# Patient Record
Sex: Male | Born: 1985 | Race: Black or African American | Hispanic: No | Marital: Single
Health system: Midwestern US, Community
[De-identification: ages and names within clinical notes are randomized; demographics above are authoritative.]

---

## 2015-12-24 ENCOUNTER — Inpatient Hospital Stay
Admit: 2015-12-24 | Discharge: 2015-12-24 | Disposition: A | Discharge status: Home / Self Care | Payer: Self-pay | Attending: Emergency Medicine

## 2015-12-24 DIAGNOSIS — K029 Dental caries, unspecified: Secondary | ICD-10-CM

## 2015-12-24 MED ORDER — HYDROCODONE-ACETAMINOPHEN 5 MG-325 MG TAB
5-325 mg | ORAL_TABLET | ORAL | 0 refills | Status: AC | PRN
Start: 2015-12-24 — End: ?

## 2015-12-24 MED ORDER — PENICILLIN V-K 500 MG TAB
500 mg | ORAL_TABLET | Freq: Four times a day (QID) | ORAL | 0 refills | Status: AC
Start: 2015-12-24 — End: 2015-12-31

## 2015-12-24 NOTE — ED Provider Notes (Signed)
HPI Comments: 6:08 PM Roberto Harper is a 30 y.o. male presents to the ED with c/o left upper dental pain onset several days ago. Pt denied n/v/d, CP, SOB, or cough. All other sx denied. No other complaints at this time.         PCP-No primary care provider on file.      Patient is a 30 y.o. male presenting with dental problem. The history is provided by the patient.   Dental Pain             History reviewed. No pertinent past medical history.    History reviewed. No pertinent surgical history.      History reviewed. No pertinent family history.    Social History     Social History   ??? Marital status: N/A     Spouse name: N/A   ??? Number of children: N/A   ??? Years of education: N/A     Occupational History   ??? Not on file.     Social History Main Topics   ??? Smoking status: Not on file   ??? Smokeless tobacco: Not on file   ??? Alcohol use Not on file   ??? Drug use: Not on file   ??? Sexual activity: Not on file     Other Topics Concern   ??? Not on file     Social History Narrative   ??? No narrative on file         ALLERGIES: Review of patient's allergies indicates no known allergies.    Review of Systems   Constitutional: Negative for fever.   HENT: Positive for dental problem (left). Negative for sore throat.    Eyes: Negative for redness and visual disturbance.   Respiratory: Negative for shortness of breath and wheezing.    Cardiovascular: Negative for chest pain.   Gastrointestinal: Negative for abdominal pain, diarrhea, nausea and vomiting.   Endocrine: Negative for polyuria.   Genitourinary: Negative for dysuria.   Musculoskeletal: Negative for arthralgias and neck stiffness.   Skin: Negative for rash.   Neurological: Negative for headaches.   All other systems reviewed and are negative.      There were no vitals filed for this visit.         Physical Exam   Constitutional: He appears well-developed and well-nourished. No distress.   HENT:   Head: Normocephalic.    Tooth #19 - rotten to the gumline - no erythema or exudate   Pulmonary/Chest: Effort normal. No stridor. No respiratory distress.   Neurological: He is alert.   Skin: He is not diaphoretic.   Nursing note and vitals reviewed.       MDM  Number of Diagnoses or Management Options  Dental caries:   Dentalgia:   Diagnosis management comments: Needs to see a dentist, ABX, [facility], [facility], work note    ED Course       Procedures      Vitals:  No data found.        Medications ordered:   Medications - No data to display      Lab findings:  No results found for this or any previous visit (from the past 12 hour(s)).    EKG interpretation by ED Physician:      X-Ray, CT or other radiology findings or impressions:  No orders to display       Progress notes, Consult notes or additional Procedure notes:       Reevaluation of patient:  Disposition:  Diagnosis:   1. Dentalgia    2. Dental caries        Disposition: dishcarge    Follow-up Information     Follow up With Details Comments Contact Info    [facility] Go in 1 day For follow up [address]  [place] [id number]  [phone]    [facility] EMERGENCY DEPT Go to As needed [address]  [place] [id number]  [phone]            Patient's Medications   Start Taking    HYDROCODONE-ACETAMINOPHEN (NORCO) 5-325 MG PER TABLET    Take 1 Tab by mouth every four (4) hours as needed for Pain. Max Daily Amount: 6 Tabs.    PENICILLIN V POTASSIUM (VEETID) 500 MG TABLET    Take 1 Tab by mouth four (4) times daily for 7 days.   Continue Taking    No medications on file   These Medications have changed    No medications on file   Stop Taking    No medications on file     Scribe Attestation  David Swazil[place]Jordan scribing for and in the presence of Kirke ShaggyMichael L Ani Deoliveira, MD 6:09 PM, 12/24/15.    Physician Attestation  I personally performed the services described in the documentation, reviewed the documentation, as recorded by the scribe in my presence, and  it accurately and completely records my words and actions.    Kirke ShaggyMichael L Karessa Onorato, MD 6:09 PM 12/24/15        Signed by : David SwazilandJordan, Scribe, 12/24/15 at 6:09 PM

## 2015-12-24 NOTE — ED Notes (Signed)
Tooth #19

## 2015-12-24 NOTE — ED Triage Notes (Signed)
Patient reports tooth ache for an hour.

## 2015-12-24 NOTE — ED Notes (Signed)
I have reviewed discharge instructions and medications with the patient. The patient verbalized understanding.Patient is stable and in good condition with normal vital signs. Patient chose to discharge with their wristband; privacy risks were discussed. Patient escorted to lobby.

## 2021-01-21 DIAGNOSIS — R519 Headache, unspecified: Secondary | ICD-10-CM | POA: Insufficient documentation

## 2021-01-21 DIAGNOSIS — H5789 Other specified disorders of eye and adnexa: Secondary | ICD-10-CM | POA: Insufficient documentation

## 2021-01-22 ENCOUNTER — Emergency Department (HOSPITAL_COMMUNITY): Payer: Medicaid Other

## 2021-01-22 ENCOUNTER — Emergency Department (HOSPITAL_COMMUNITY)
Admission: EM | Admit: 2021-01-22 | Discharge: 2021-01-22 | Disposition: A | Discharge status: Home / Self Care | Payer: Medicaid Other | Attending: Emergency Medicine | Admitting: Emergency Medicine

## 2021-01-22 ENCOUNTER — Other Ambulatory Visit: Payer: Self-pay

## 2021-01-22 ENCOUNTER — Encounter (HOSPITAL_COMMUNITY): Payer: Self-pay

## 2021-01-22 DIAGNOSIS — R519 Headache, unspecified: Secondary | ICD-10-CM

## 2021-01-22 LAB — CBC WITH DIFFERENTIAL/PLATELET
Abs Immature Granulocytes: 0.01 10*3/uL (ref 0.00–0.07)
Basophils Absolute: 0 10*3/uL (ref 0.0–0.1)
Basophils Relative: 1 %
Eosinophils Absolute: 0 10*3/uL (ref 0.0–0.5)
Eosinophils Relative: 0 %
HCT: 45.7 % (ref 39.0–52.0)
Hemoglobin: 14.8 g/dL (ref 13.0–17.0)
Immature Granulocytes: 0 %
Lymphocytes Relative: 38 %
Lymphs Abs: 2.4 10*3/uL (ref 0.7–4.0)
MCH: 27.2 pg (ref 26.0–34.0)
MCHC: 32.4 g/dL (ref 30.0–36.0)
MCV: 84 fL (ref 80.0–100.0)
Monocytes Absolute: 0.7 10*3/uL (ref 0.1–1.0)
Monocytes Relative: 11 %
Neutro Abs: 3.2 10*3/uL (ref 1.7–7.7)
Neutrophils Relative %: 50 %
Platelets: 278 10*3/uL (ref 150–400)
RBC: 5.44 MIL/uL (ref 4.22–5.81)
RDW: 13.4 % (ref 11.5–15.5)
WBC: 6.3 10*3/uL (ref 4.0–10.5)
nRBC: 0 % (ref 0.0–0.2)

## 2021-01-22 LAB — COMPREHENSIVE METABOLIC PANEL
ALT: 14 U/L (ref 0–44)
AST: 18 U/L (ref 15–41)
Albumin: 5.2 g/dL — ABNORMAL HIGH (ref 3.5–5.0)
Alkaline Phosphatase: 71 U/L (ref 38–126)
Anion gap: 11 (ref 5–15)
BUN: 15 mg/dL (ref 6–20)
CO2: 27 mmol/L (ref 22–32)
Calcium: 10.3 mg/dL (ref 8.9–10.3)
Chloride: 103 mmol/L (ref 98–111)
Creatinine, Ser: 1.14 mg/dL (ref 0.61–1.24)
GFR, Estimated: >60 mL/min (ref >60)
Glucose, Bld: 124 mg/dL — ABNORMAL HIGH (ref 70–99)
Potassium: 4.1 mmol/L (ref 3.5–5.1)
Sodium: 141 mmol/L (ref 135–145)
Total Bilirubin: 0.6 mg/dL (ref 0.3–1.2)
Total Protein: 9.4 g/dL — ABNORMAL HIGH (ref 6.5–8.1)

## 2021-01-22 LAB — CK: Total CK: 238 U/L (ref 49–397)

## 2021-01-22 MED ORDER — DIPHENHYDRAMINE HCL 50 MG/ML IJ SOLN
25.0000 mg | Freq: Once | INTRAMUSCULAR | Status: DC
  Filled 2021-01-22: qty 1

## 2021-01-22 MED ORDER — KETOROLAC TROMETHAMINE 30 MG/ML IJ SOLN
30.0000 mg | Freq: Once | INTRAMUSCULAR | Status: DC
  Filled 2021-01-22: qty 1

## 2021-01-22 MED ORDER — SODIUM CHLORIDE 0.9 % IV BOLUS
1000.0000 mL | Freq: Once | INTRAVENOUS | Status: AC
  Administered 2021-01-22: 1000 mL via INTRAVENOUS

## 2021-01-22 MED ORDER — METOCLOPRAMIDE HCL 5 MG/ML IJ SOLN
10.0000 mg | Freq: Once | INTRAMUSCULAR | Status: DC
  Filled 2021-01-22: qty 2

## 2021-01-22 NOTE — Progress Notes (Signed)
..  Transition of Care [facility]) - Emergency Department Mini Assessment   Patient Details  Name: Micheal Harrell MRN: [id number] Date of Birth: 16-Nov-1985  Transition of Care [facility]) CM/SW Contact:    Larrie Kass, LCSW Phone Number: 01/22/2021, 8:47 AM   Clinical Narrative:  CSW spoke with pt to verify address. Pt reproted his address is [address] [place] ,[place]. Pt reported not having anyone to pick him up and requested transportation. CSW to contact safe transport.   Valentina Shaggy.Jadae Steinke, MSW, LCSWA [facility]  Transitions of Care Clinical Social Worker I Direct Dial: [phone]  Fax: [phone] [email or web address]    ED Mini Assessment:    Barriers to Discharge: No Barriers Identified        Interventions which prevented an admission or readmission: Transportation Screening    Patient Contact and Communications        ,                 Admission diagnosis:  Flu Symptoms There are no problems to display for this patient.  PCP:  Pcp, No Pharmacy:  No Pharmacies Listed

## 2021-01-22 NOTE — ED Triage Notes (Signed)
Pt reports headache all day today.

## 2021-01-22 NOTE — ED Provider Notes (Signed)
[facility]-EMERGENCY DEPT Provider Note   CSN: [id number] Arrival date & time: 01/21/21  2325     History Chief Complaint  Patient presents with   Headache    Micheal Harrell is a 35 y.o. male.  Patient is a poor historian.  Unclear if he has an underlying psychiatric illness.  Patient apparently called the police because he was having a headache after walking around outside today. Denies any fall or trauma.  Headache is gradual in onset.  States there is no associated nausea, vomiting, photophobia or phonophobia.  No fever.  No focal weakness, numbness or tingling.  No chest pain or shortness of breath.  Not taking medication for headache.  Denies thunderclap onset of his headache.  Denies any drug or alcohol use.  His eyes are very injected but he denies any visual complaints. States he does not have any thoughts of wanting to hurt himself or anyone else.  Chart review shows that patient has multiple visits for schizophrenia and paranoia within the [facility] system.   The history is provided by the patient and the police. The history is limited by the condition of the patient.  Headache Associated symptoms: no abdominal pain, no congestion, no cough, no dizziness, no fever, no myalgias, no nausea, no numbness, no vomiting and no weakness       History reviewed. No pertinent past medical history.  There are no problems to display for this patient.   History reviewed. No pertinent surgical history.     No family history on file.  Social History   Tobacco Use   Smoking status: Never   Smokeless tobacco: Never    Home Medications Prior to Admission medications   Not on File    Allergies    Patient has no known allergies.  Review of Systems   Review of Systems  Constitutional:  Negative for activity change, appetite change and fever.  HENT:  Negative for congestion.   Eyes:  Positive for redness. Negative for visual disturbance.  Respiratory:   Negative for cough, chest tightness and shortness of breath.   Cardiovascular:  Negative for chest pain.  Gastrointestinal:  Negative for abdominal pain, nausea and vomiting.  Genitourinary:  Negative for dysuria and hematuria.  Musculoskeletal:  Negative for arthralgias and myalgias.  Skin:  Negative for rash.  Neurological:  Positive for headaches. Negative for dizziness, weakness, light-headedness and numbness.   all other systems are negative except as noted in the HPI and PMH.   Physical Exam Updated Vital Signs BP 134/86 (BP Location: Left Arm)   Pulse 99   Temp 98.2 F (36.8 C)   Resp 18   SpO2 100%   Physical Exam Vitals and nursing note reviewed.  Constitutional:      General: He is not in acute distress.    Appearance: He is well-developed.     Comments: Flat affect  HENT:     Head: Normocephalic and atraumatic.     Mouth/Throat:     Pharynx: No oropharyngeal exudate.  Eyes:     Conjunctiva/sclera: Conjunctivae normal.     Pupils: Pupils are equal, round, and reactive to light.     Comments: Conjunctival injection bilaterally.  Neck:     Comments: No meningismus. Cardiovascular:     Rate and Rhythm: Normal rate and regular rhythm.     Heart sounds: Normal heart sounds. No murmur heard. Pulmonary:     Effort: Pulmonary effort is normal. No respiratory distress.  Breath sounds: Normal breath sounds.  Abdominal:     Palpations: Abdomen is soft.     Tenderness: There is no abdominal tenderness. There is no guarding or rebound.  Musculoskeletal:        General: No tenderness. Normal range of motion.     Cervical back: Normal range of motion and neck supple.  Skin:    General: Skin is warm.  Neurological:     Mental Status: He is alert and oriented to person, place, and time.     Cranial Nerves: No cranial nerve deficit.     Motor: No abnormal muscle tone.     Coordination: Coordination normal.     Comments: CN 2-12 intact, no ataxia on finger to nose, no  nystagmus, 5/5 strength throughout, no pronator drift, Romberg negative, normal gait.   Psychiatric:        Behavior: Behavior normal.    ED Results / Procedures / Treatments   Labs (all labs ordered are listed, but only abnormal results are displayed) Labs Reviewed  COMPREHENSIVE METABOLIC PANEL - Abnormal; Notable for the following components:      Result Value   Glucose, Bld 124 (*)    Total Protein 9.4 (*)    Albumin 5.2 (*)    All other components within normal limits  CBC WITH DIFFERENTIAL/PLATELET  CK    EKG None  Radiology CT Head Wo Contrast  Result Date: 01/22/2021 CLINICAL DATA:  Head trauma with headache EXAM: CT HEAD WITHOUT CONTRAST TECHNIQUE: Contiguous axial images were obtained from the base of the skull through the vertex without intravenous contrast. COMPARISON:  None. FINDINGS: Brain: No evidence of acute infarction, hemorrhage, hydrocephalus, extra-axial collection or mass lesion/mass effect. Vascular: No hyperdense vessel or unexpected calcification. Skull: Normal. Negative for fracture or focal lesion. Sinuses/Orbits: No acute finding. IMPRESSION: Negative head CT. Electronically Signed   By: Marnee Spring M.D.   On: 01/22/2021 04:37    Procedures Procedures   Medications Ordered in ED Medications  sodium chloride 0.9 % bolus 1,000 mL (has no administration in time range)  ketorolac (TORADOL) 30 MG/ML injection 30 mg (has no administration in time range)  metoCLOPramide (REGLAN) injection 10 mg (has no administration in time range)  diphenhydrAMINE (BENADRYL) injection 25 mg (has no administration in time range)    ED Course  I have reviewed the triage vital signs and the nursing notes.  Pertinent labs & imaging results that were available during my care of the patient were reviewed by me and considered in my medical decision making (see chart for details).    MDM Rules/Calculators/A&P                         Right-sided headache gradual in  onset.  Nonfocal neurological exam.  Denies thunderclap onset.  Low suspicion for subarachnoid hemorrhage, meningitis, temporal arteritis  Patient hydrated.  He actually refused medication stating his headache was better.  Labs are reassuring with normal electrolytes and normal CK.  CT head is negative.  Patient appears stable for discharge.  He is tolerating p.o. and ambulatory.  Denies suicidal or homicidal thoughts. He is visiting from [place] and not certain how he got here or where he is intending to go.  Case manager will be consulted for assistance. Final Clinical Impression(s) / ED Diagnoses Final diagnoses:  Bad headache    Rx / DC Orders ED Discharge Orders     None  Glynn Octave, MD 01/22/21 [phone]

## 2021-01-22 NOTE — ED Notes (Signed)
Pt refused all medications and stated that he didn't have a headache anymore

## 2021-01-22 NOTE — ED Notes (Signed)
Pt refused medication, stating that he no longer has a headache.

## 2021-01-22 NOTE — Discharge Instructions (Addendum)
Your testing is reassuring.  Keep yourself hydrated and follow-up with your doctor.  Return to the ED if you develop new or worsening symptoms

## 2022-07-02 IMAGING — CT CT HEAD W/O CM
3 series · 16 of 47 positions shown, 19 images · non-contrast
Comparison: None.

CLINICAL DATA: Head trauma with headache

EXAM:
CT HEAD WITHOUT CONTRAST
TECHNIQUE: Contiguous axial images were obtained from the base of the skull
through the vertex without intravenous contrast.

[Series 2: head wo · axial · 0.47mm/px · z∈[+1644,+1769]mm · 10 of 31 slices shown, 13 images]
[im 3/31  brain]
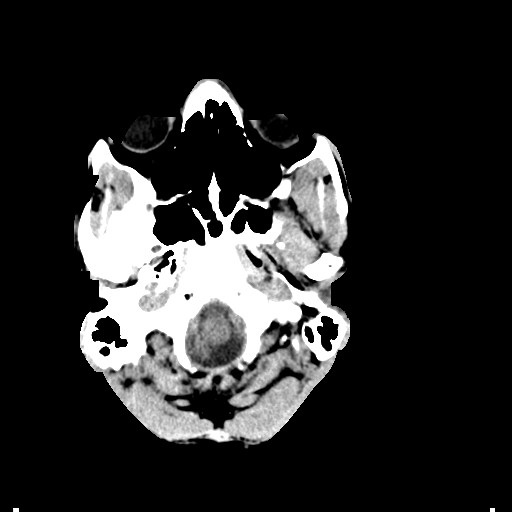
[im 3/31  bone]
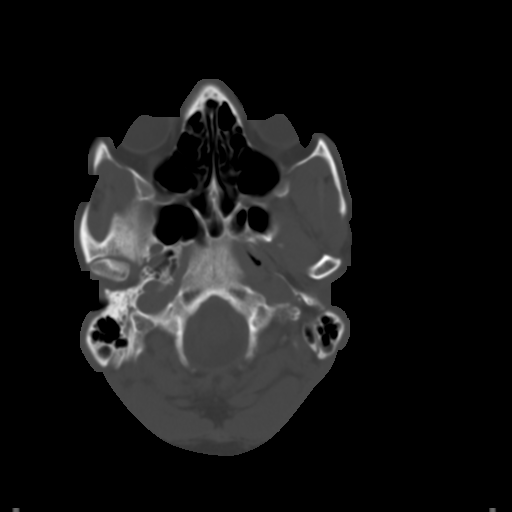
[im 6/31  brain]
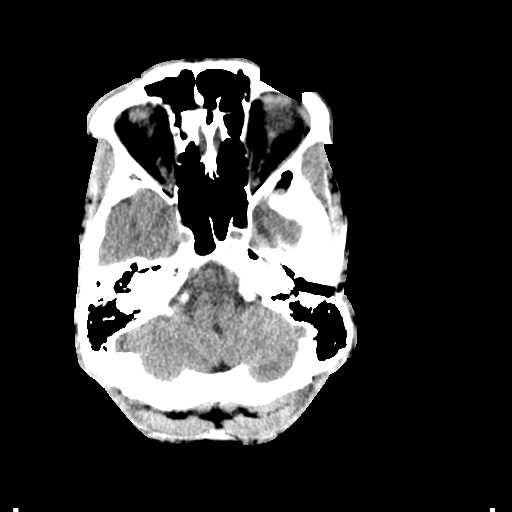
[im 9/31  brain]
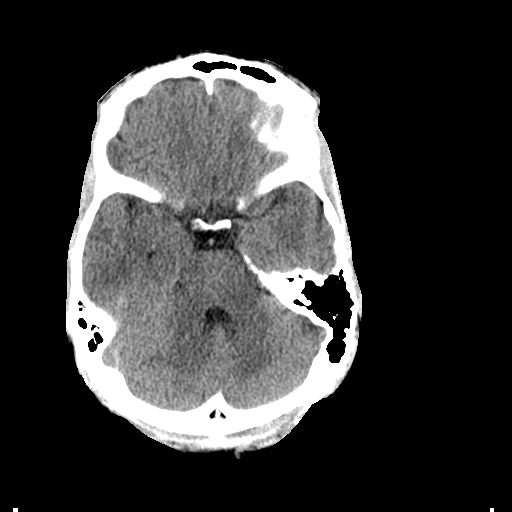
[im 11/31  brain]
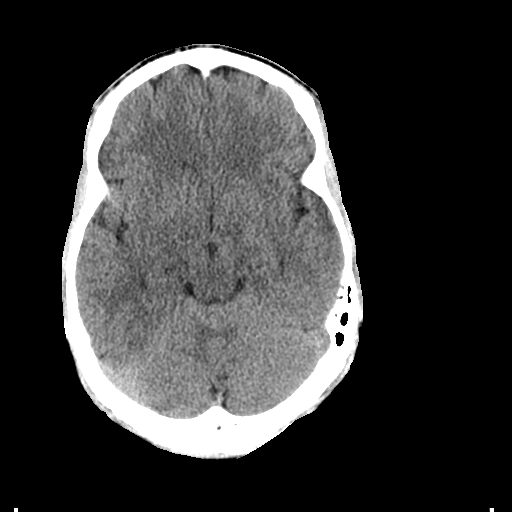
[im 14/31  brain]
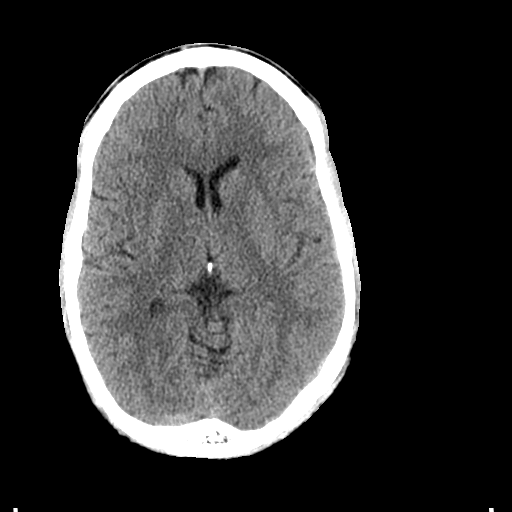
[im 14/31  bone]
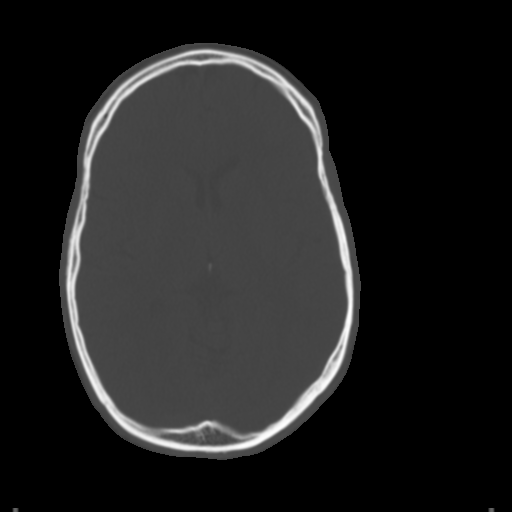
[im 17/31  brain]
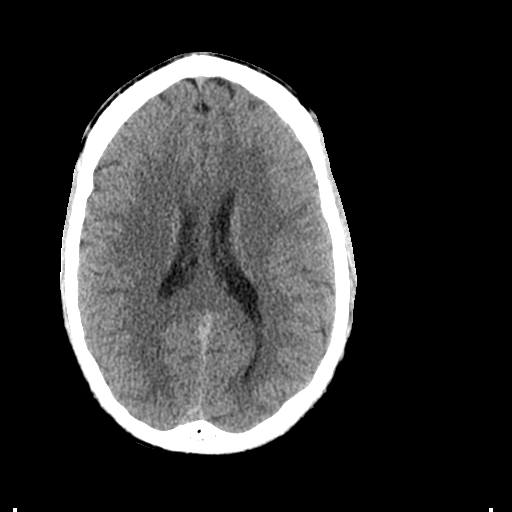
[im 20/31  brain]
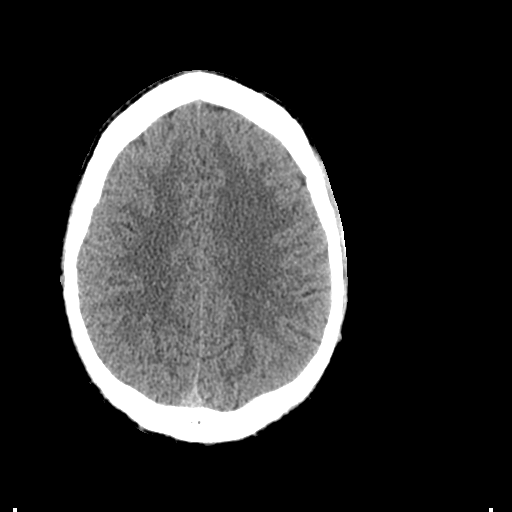
[im 23/31  brain]
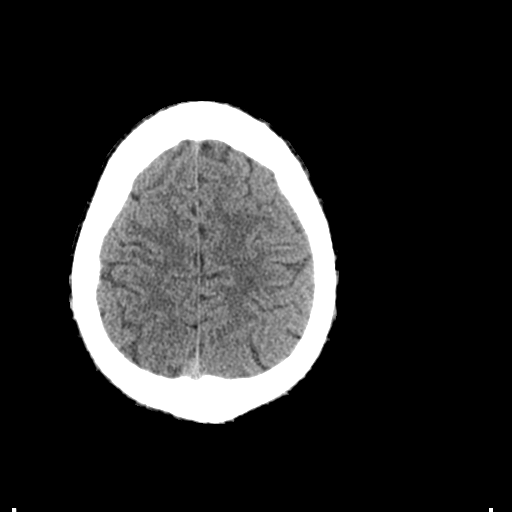
[im 25/31  brain]
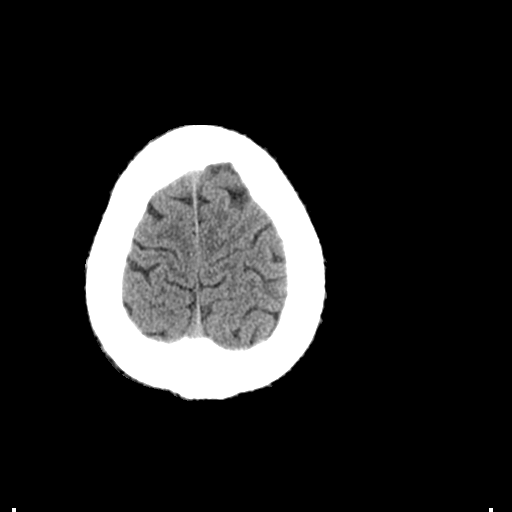
[im 25/31  bone]
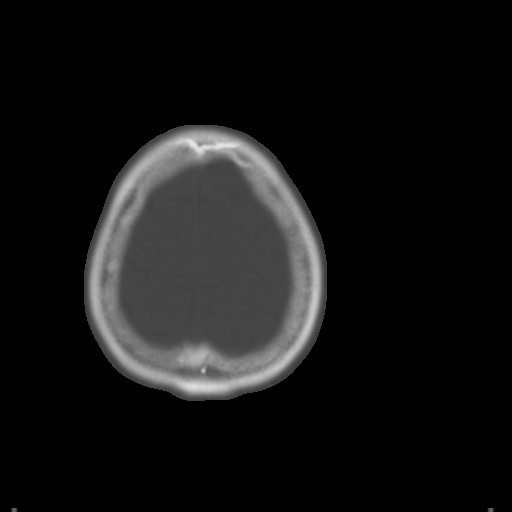
[im 28/31  brain]
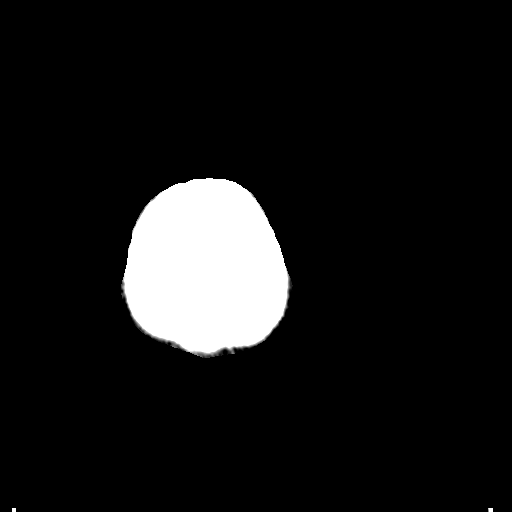

[Series 4: coronal soft tissue · coronal · 0.29mm/px · 3 of 70 slices shown]
[im 24/70  brain]
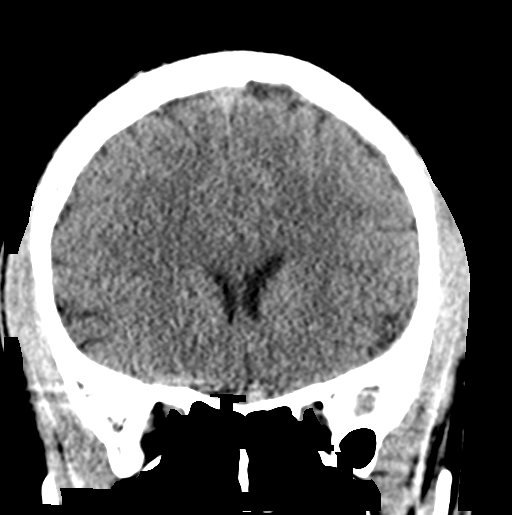
[im 31/70  brain]
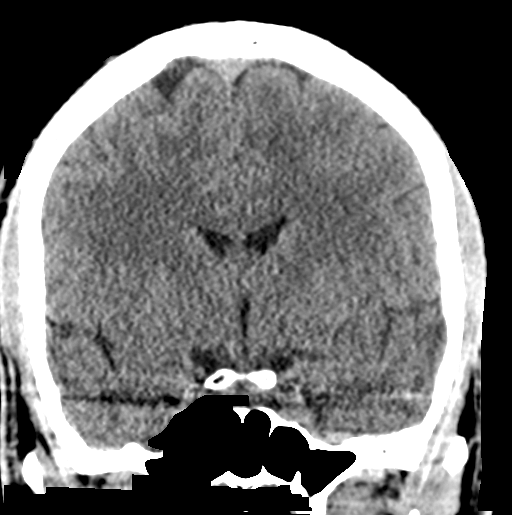
[im 39/70  brain]
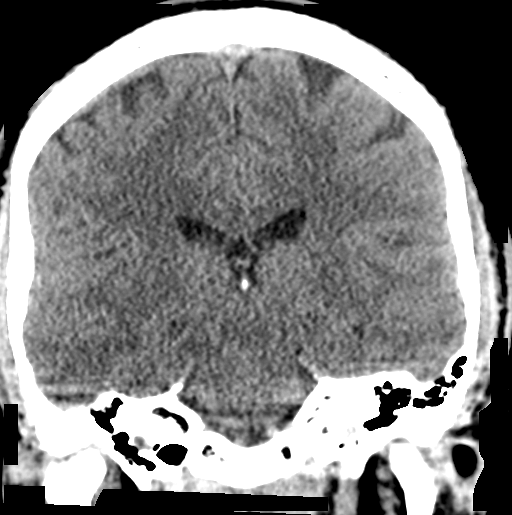

[Series 5: sagittal soft tissue · sagittal · 0.29mm/px · 3 of 52 slices shown]
[im 18/52  brain]
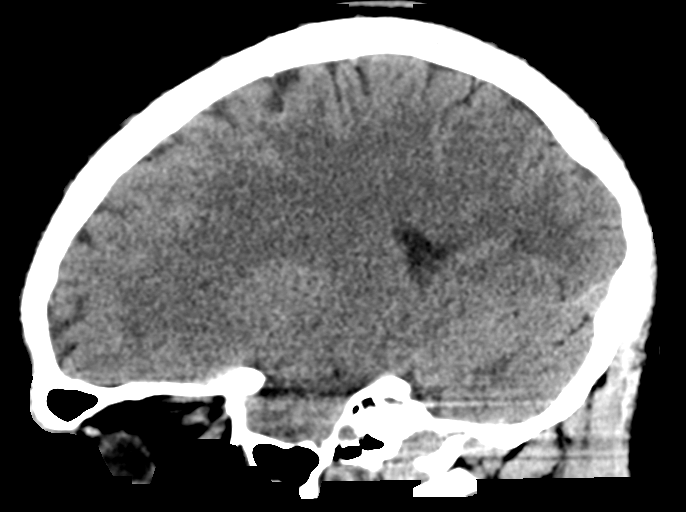
[im 26/52  brain]
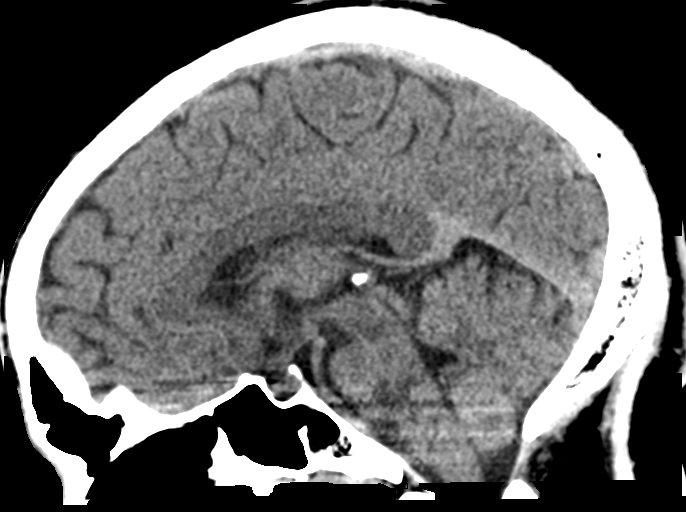
[im 35/52  brain]
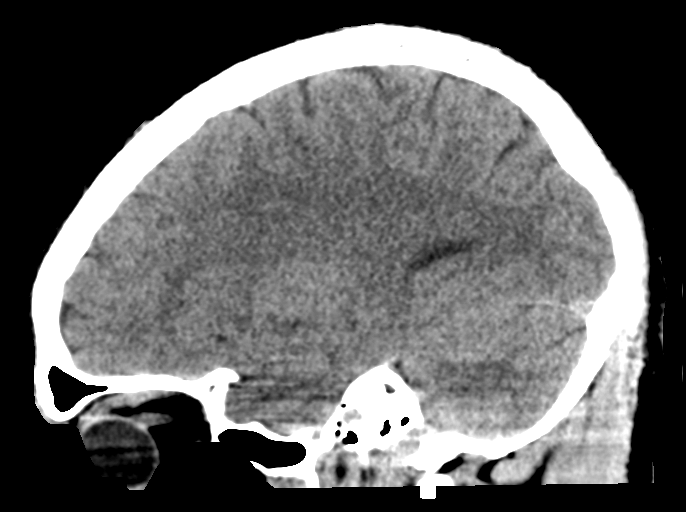

[16 of 47 positions shown; findings below may reference images not displayed]

FINDINGS: Brain: No evidence of acute infarction, hemorrhage, hydrocephalus,
extra-axial collection or mass lesion/mass effect.

Vascular: No hyperdense vessel or unexpected calcification.

Skull: Normal. Negative for fracture or focal lesion.

Sinuses/Orbits: No acute finding.
IMPRESSION: Negative head CT.
